# Patient Record
Sex: Female | Born: 1980 | Race: Black or African American | Hispanic: No | Marital: Single | Smoking: Never smoker
Health system: Southern US, Community
[De-identification: ages and names within clinical notes are randomized; demographics above are authoritative.]

## PROBLEM LIST (undated history)

## (undated) DIAGNOSIS — J45909 Unspecified asthma, uncomplicated: Secondary | ICD-10-CM

## (undated) DIAGNOSIS — E119 Type 2 diabetes mellitus without complications: Secondary | ICD-10-CM

## (undated) DIAGNOSIS — D649 Anemia, unspecified: Secondary | ICD-10-CM

## (undated) DIAGNOSIS — I959 Hypotension, unspecified: Secondary | ICD-10-CM

## (undated) HISTORY — PX: TOTAL HIP ARTHROPLASTY: SHX124

---

## 2015-06-10 HISTORY — PX: LEG SURGERY: SHX1003

## 2015-06-10 HISTORY — PX: PELVIC FRACTURE SURGERY: SHX119

## 2017-08-15 ENCOUNTER — Encounter (HOSPITAL_COMMUNITY): Payer: Self-pay | Admitting: Emergency Medicine

## 2017-08-15 ENCOUNTER — Emergency Department (HOSPITAL_COMMUNITY): Payer: Medicaid Other

## 2017-08-15 ENCOUNTER — Emergency Department (HOSPITAL_COMMUNITY)
Admission: EM | Admit: 2017-08-15 | Discharge: 2017-08-16 | Disposition: A | Discharge status: Home / Self Care | Payer: Medicaid Other | Attending: Emergency Medicine | Admitting: Emergency Medicine

## 2017-08-15 DIAGNOSIS — J45909 Unspecified asthma, uncomplicated: Secondary | ICD-10-CM | POA: Insufficient documentation

## 2017-08-15 DIAGNOSIS — Z7984 Long term (current) use of oral hypoglycemic drugs: Secondary | ICD-10-CM | POA: Insufficient documentation

## 2017-08-15 DIAGNOSIS — E119 Type 2 diabetes mellitus without complications: Secondary | ICD-10-CM | POA: Insufficient documentation

## 2017-08-15 DIAGNOSIS — Z79899 Other long term (current) drug therapy: Secondary | ICD-10-CM | POA: Insufficient documentation

## 2017-08-15 DIAGNOSIS — R1084 Generalized abdominal pain: Secondary | ICD-10-CM | POA: Diagnosis not present

## 2017-08-15 HISTORY — DX: Anemia, unspecified: D64.9

## 2017-08-15 HISTORY — DX: Hypotension, unspecified: I95.9

## 2017-08-15 HISTORY — DX: Type 2 diabetes mellitus without complications: E11.9

## 2017-08-15 HISTORY — DX: Unspecified asthma, uncomplicated: J45.909

## 2017-08-15 LAB — CBC WITH DIFFERENTIAL/PLATELET
BASOS ABS: 0 10*3/uL (ref 0.0–0.1)
Basophils Relative: 0 %
EOS PCT: 2 %
Eosinophils Absolute: 0.2 10*3/uL (ref 0.0–0.7)
HEMATOCRIT: 32.2 % — AB (ref 36.0–46.0)
HEMOGLOBIN: 10 g/dL — AB (ref 12.0–15.0)
LYMPHS ABS: 2 10*3/uL (ref 0.7–4.0)
LYMPHS PCT: 20 %
MCH: 24.1 pg — AB (ref 26.0–34.0)
MCHC: 31.1 g/dL (ref 30.0–36.0)
MCV: 77.6 fL — AB (ref 78.0–100.0)
Monocytes Absolute: 0.7 10*3/uL (ref 0.1–1.0)
Monocytes Relative: 7 %
NEUTROS ABS: 7.1 10*3/uL (ref 1.7–7.7)
NEUTROS PCT: 71 %
PLATELETS: 573 10*3/uL — AB (ref 150–400)
RBC: 4.15 MIL/uL (ref 3.87–5.11)
RDW: 14.4 % (ref 11.5–15.5)
WBC: 10.1 10*3/uL (ref 4.0–10.5)

## 2017-08-15 LAB — COMPREHENSIVE METABOLIC PANEL
ALK PHOS: 120 U/L (ref 38–126)
ALT: 7 U/L — AB (ref 14–54)
AST: 12 U/L — AB (ref 15–41)
Albumin: 3.4 g/dL — ABNORMAL LOW (ref 3.5–5.0)
Anion gap: 12 (ref 5–15)
BUN: 10 mg/dL (ref 6–20)
CALCIUM: 9.3 mg/dL (ref 8.9–10.3)
CHLORIDE: 105 mmol/L (ref 101–111)
CO2: 23 mmol/L (ref 22–32)
CREATININE: 0.53 mg/dL (ref 0.44–1.00)
GFR calc Af Amer: >60 mL/min (ref >60)
GFR calc non Af Amer: >60 mL/min (ref >60)
Glucose, Bld: 124 mg/dL — ABNORMAL HIGH (ref 65–99)
Potassium: 3.9 mmol/L (ref 3.5–5.1)
Sodium: 140 mmol/L (ref 135–145)
Total Bilirubin: 0.5 mg/dL (ref 0.3–1.2)
Total Protein: 8.5 g/dL — ABNORMAL HIGH (ref 6.5–8.1)

## 2017-08-15 MED ORDER — IOPAMIDOL (ISOVUE-300) INJECTION 61%
100.0000 mL | Freq: Once | INTRAVENOUS | Status: AC | PRN
  Administered 2017-08-15: 100 mL via INTRAVENOUS
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
## 2017-08-16 LAB — URINALYSIS, ROUTINE W REFLEX MICROSCOPIC
BILIRUBIN URINE: NEGATIVE
Glucose, UA: NEGATIVE mg/dL
Ketones, ur: NEGATIVE mg/dL
Nitrite: NEGATIVE
Protein, ur: 100 mg/dL — AB
pH: 7 (ref 5.0–8.0)

## 2017-08-16 LAB — PREGNANCY, URINE: PREG TEST UR: NEGATIVE

## 2017-08-16 MED ORDER — POLYETHYLENE GLYCOL 3350 17 G PO PACK
17.0000 g | PACK | Freq: Once | ORAL | Status: AC
Start: 2017-08-16 — End: 2017-08-16
  Administered 2017-08-16: 17 g via ORAL
  Filled 2017-08-16: qty 1

## 2017-08-16 MED ORDER — POLYETHYLENE GLYCOL 3350 17 G PO PACK: 17.0000 g | PACK | Freq: Every day | ORAL | 0 refills | Status: AC

## 2017-08-16 NOTE — ED Provider Notes (Signed)
Medical screening examination/treatment/procedure(s) were performed by non-physician practitioner and as supervising physician I was immediately available for consultation/collaboration.   EKG Interpretation None        Signed out pending urinalysis.  Urine has too numerous to count white cells with rare bacteria in the setting of a chronic indwelling Foley.  Urine culture is pending.  Given she is afebrile without significant leukocytosis, would not elect to treat at this time.   Shon BatonHorton, Courtney F, MD 08/16/17 [phone]

## 2017-08-16 NOTE — Discharge Instructions (Signed)
Return if any problems.  Try miralax to help with constipation

## 2017-08-18 LAB — URINE CULTURE: Culture: >=100000 — AB

## 2017-08-19 ENCOUNTER — Telehealth: Payer: Self-pay | Admitting: Emergency Medicine

## 2017-08-19 NOTE — Progress Notes (Signed)
ED Antimicrobial Stewardship Positive Culture Follow Up   Sarah Taylor is an 37 y.o. female who presented to [facility] on 08/15/2017 with a chief complaint of abdominal pain, nausea and vomiting.  Chief Complaint  Patient presents with  . Abdominal Pain    Recent Results (from the past 720 hour(s))  Urine Culture     Status: Abnormal   Collection Time: 08/16/17  1:04 AM  Result Value Ref Range Status   Specimen Description   Final    URINE, CLEAN CATCH Performed at [facility], [address] [place], [place] [address]    Special Requests   Final    NONE Performed at [facility], [address] [place], [place] [address]    Culture (A)  Final    >=100,000 COLONIES/mL KLEBSIELLA PNEUMONIAE Confirmed Extended Spectrum Beta-Lactamase Producer (ESBL).  In bloodstream infections from ESBL organisms, carbapenems are preferred over piperacillin/tazobactam. They are shown to have a lower risk of mortality. Performed at [facility], [address] [place], [place] [address]    Report Status 08/18/2017 FINAL  Final   Organism ID, Bacteria KLEBSIELLA PNEUMONIAE (A)  Final      Susceptibility   Klebsiella pneumoniae - MIC*    AMPICILLIN >=32 RESISTANT Resistant     CEFAZOLIN >=64 RESISTANT Resistant     CEFTRIAXONE >=64 RESISTANT Resistant     CIPROFLOXACIN >=4 RESISTANT Resistant     GENTAMICIN >=16 RESISTANT Resistant     IMIPENEM <=0.25 SENSITIVE Sensitive     NITROFURANTOIN 256 RESISTANT Resistant     TRIMETH/SULFA <=20 SENSITIVE Sensitive     AMPICILLIN/SULBACTAM >=32 RESISTANT Resistant     PIP/TAZO 32 INTERMEDIATE Intermediate     Extended ESBL POSITIVE Resistant     * >=100,000 COLONIES/mL KLEBSIELLA PNEUMONIAE    [x]  Patient discharged originally without antimicrobial agent.   New antibiotic prescription: No treatment. Come back to ED if have worsening symptoms.   ED Provider: Alveria ApleySophia Caccavale, PA-C   Adline PotterSabrina Sueo Cullen, PharmD Pharmacy Resident Pager:  [phone]

## 2017-08-19 NOTE — Telephone Encounter (Signed)
Post ED Visit - Positive Culture Follow-up  Culture report reviewed by antimicrobial stewardship pharmacist:  []  Enzo BiNathan Batchelder, Pharm.D. []  Celedonio MiyamotoJeremy Frens, Pharm.D., BCPS AQ-ID []  Garvin FilaMike Maccia, Pharm.D., BCPS []  Georgina PillionElizabeth Martin, Pharm.D., BCPS []  ZempleMinh Pham, [place]., BCPS, AAHIVP []  Estella HuskMichelle Turner, Pharm.D., BCPS, AAHIVP []  Lysle Pearlachel Rumbarger, PharmD, BCPS []  Blake DivineShannon Parkey, PharmD []  Pollyann SamplesAndy Johnston, PharmD, BCPS [facility] Theresa PharmD  Positive urine culture Treated with ciprofloxacin and Nitrofurantoin,no further patient follow-up is required at this time.  Berle MullMiller, Radley Barto 08/19/2017, 11:16 AM
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
## 2018-06-09 HISTORY — PX: OSTOMY: SHX5997

## 2018-07-12 IMAGING — CT CT ABD-PELV W/ CM
2 of 4 series · 15 of 46 positions shown, 17 images · IV contrast (Isovue)
Comparison: None.

CLINICAL DATA: Abdominal pain for 2 weeks with nausea

EXAM:
CT ABDOMEN AND PELVIS WITH CONTRAST
TECHNIQUE: Multidetector CT imaging of the abdomen and pelvis was performed
using the standard protocol following bolus administration of
intravenous contrast.
CONTRAST:  100mL W8DZFT-6AA IOPAMIDOL (W8DZFT-6AA) INJECTION 61%

[Series 2: axial st · axial · 0.64mm/px · z∈[+1094,+1559]mm · 12 of 103 slices shown, 14 images]
[im 5/103  soft-tissue]
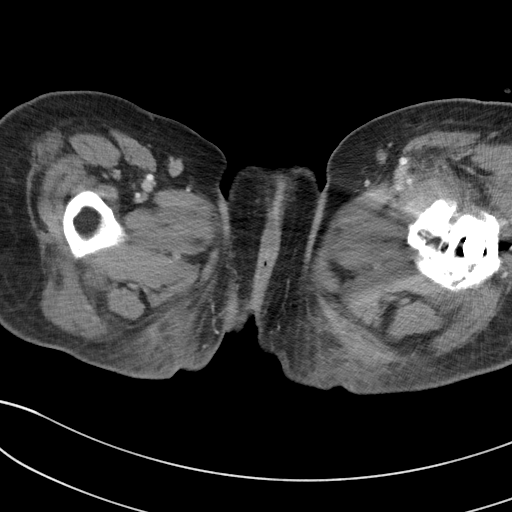
[im 5/103  bone]
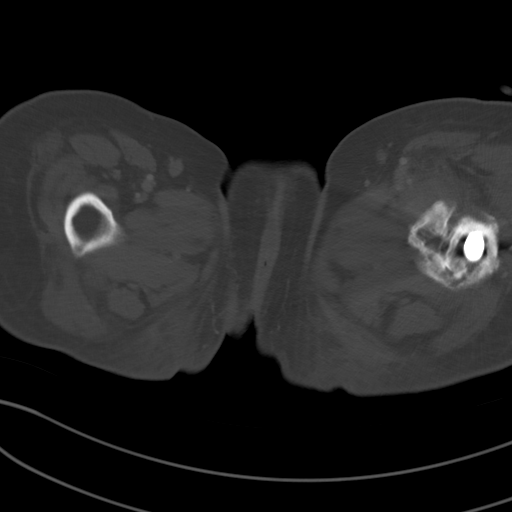
[im 13/103  soft-tissue]
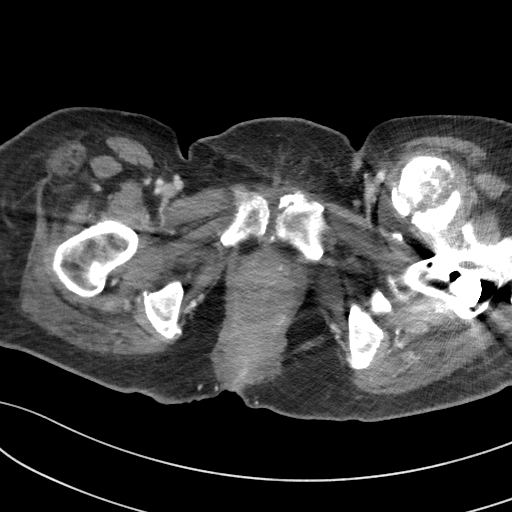
[im 22/103  soft-tissue]
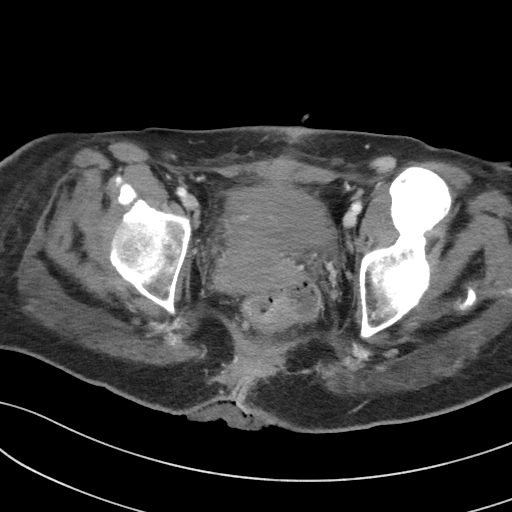
[im 30/103  soft-tissue]
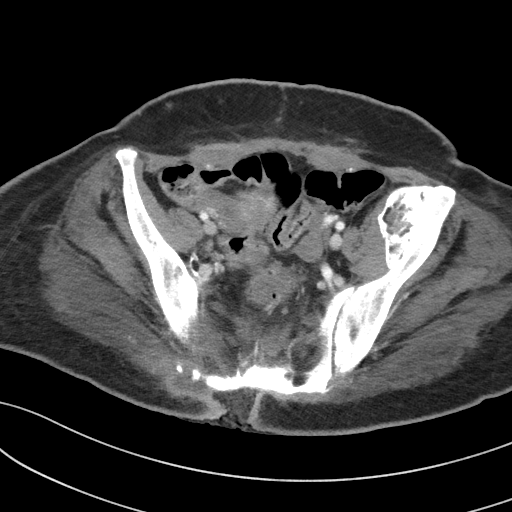
[im 39/103  soft-tissue]
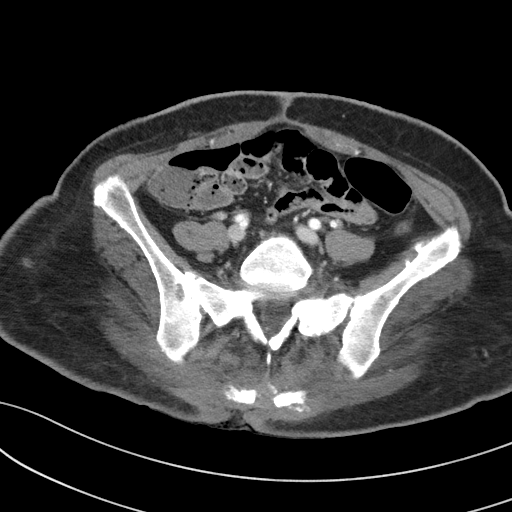
[im 47/103  soft-tissue]
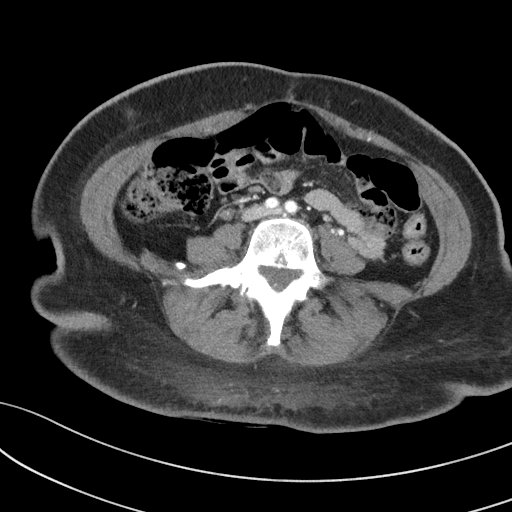
[im 56/103  soft-tissue]
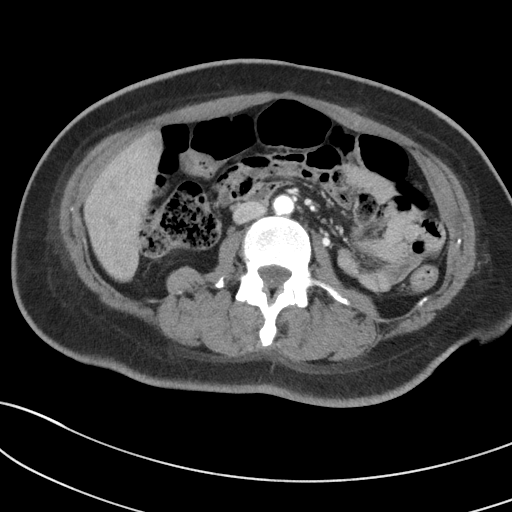
[im 64/103  soft-tissue]
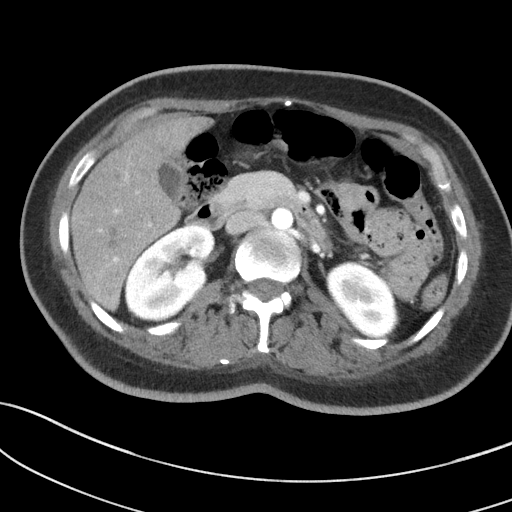
[im 73/103  soft-tissue]
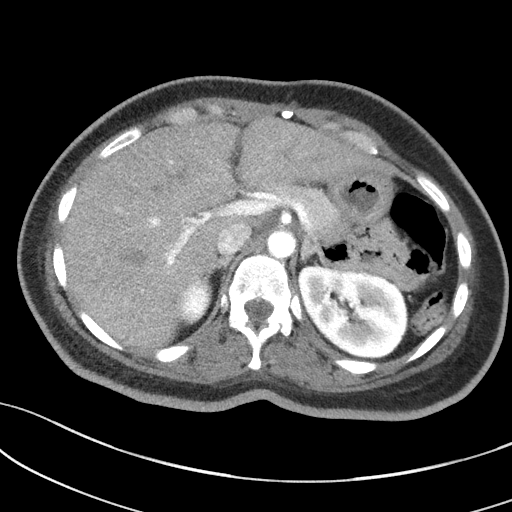
[im 73/103  bone]
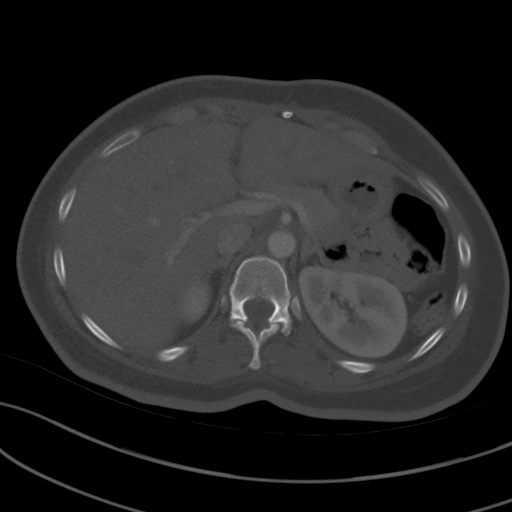
[im 81/103  soft-tissue]
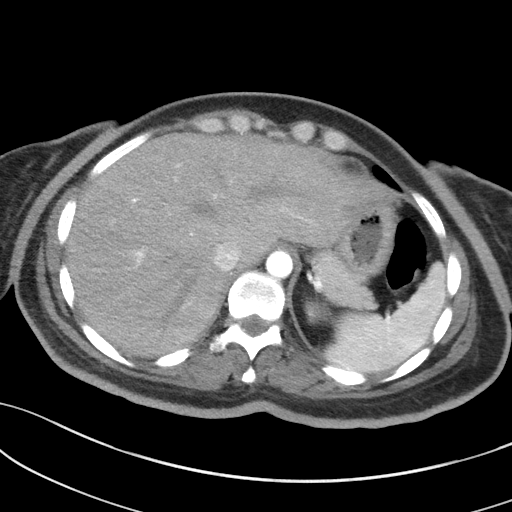
[im 90/103  soft-tissue]
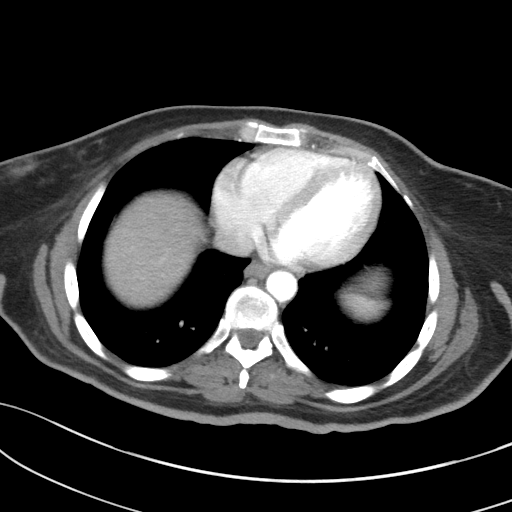
[im 98/103  soft-tissue]
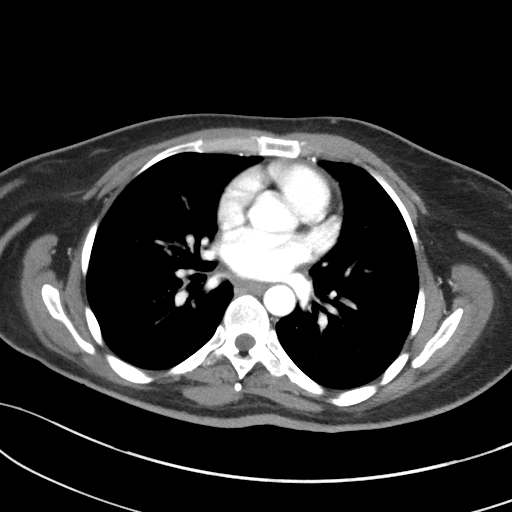

[Series 5: coronal st · coronal · 0.77mm/px · 3 of 97 slices shown]
[im 33/97  soft-tissue]
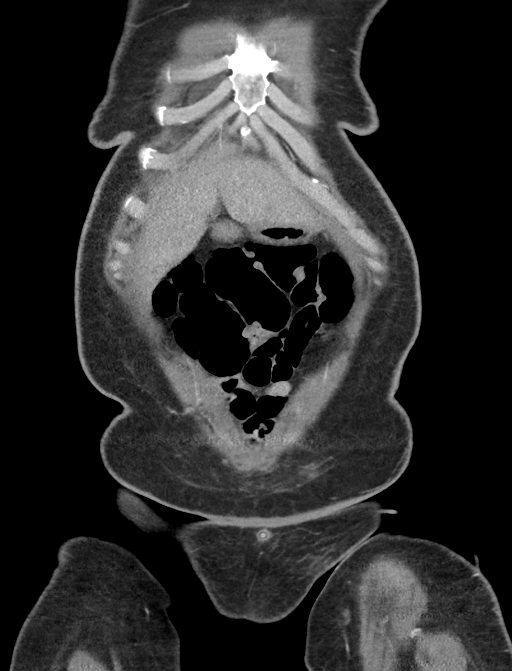
[im 43/97  soft-tissue]
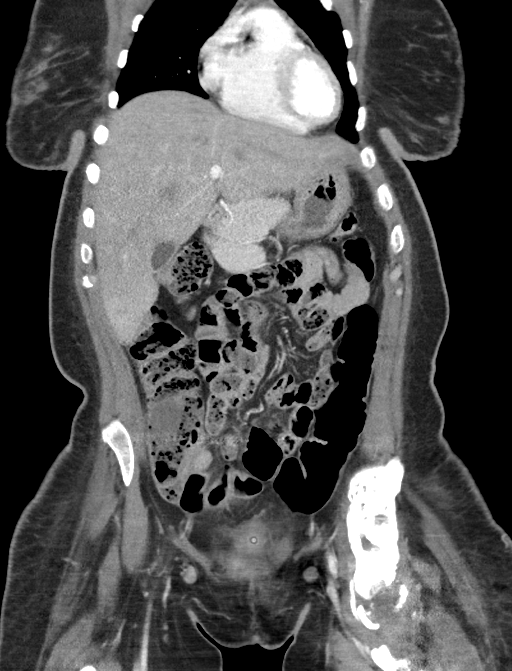
[im 54/97  soft-tissue]
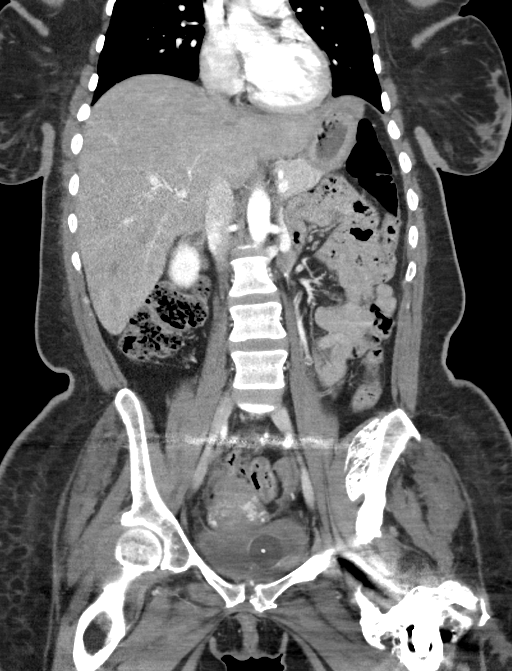

[15 of 46 positions shown; findings below may reference images not displayed]

FINDINGS: Lower chest: Lung bases demonstrate no acute consolidation or
pleural effusion. 4 mm ground-glass nodule in the medial right lung
base, series 4, image number 10. Normal heart size.

Hepatobiliary: Heterogenous fatty infiltration of the liver. No
calcified gallstones or biliary dilatation

Pancreas: Unremarkable. No pancreatic ductal dilatation or
surrounding inflammatory changes.

Spleen: Normal in size without focal abnormality.

Adrenals/Urinary Tract: Adrenal glands are unremarkable. Kidneys are
normal, without renal calculi, focal lesion, or hydronephrosis.
Foley catheter in the bladder.

Stomach/Bowel: Stomach is within normal limits. Appendix appears
normal. No evidence of bowel wall thickening, distention, or
inflammatory changes.

Vascular/Lymphatic: Nonaneurysmal aorta. No significantly enlarged
lymph nodes.

Reproductive: Uterus and bilateral adnexa are unremarkable.

Other: Negative for free air or free fluid.

Musculoskeletal: Deep sacral decubitus ulcer. Skin thickening and
soft tissue edema posteriorly. Sclerosis and bony remodeling of the
sacrococcygeal bone. No rim enhancing fluid collection in the region
of the ulcer.

Fixating rod and screws in the proximal left femur with screw tips
approaching the femoral head cortex. Marked bony deformity of the
trochanter of the left femur. Large calcified and soft tissue mass
anterior to the left hip measuring 5.9 x 6.2 cm, contiguous with
bulky ossification at the left iliac bone.
IMPRESSION: 1. No CT evidence for acute intra-abdominal or pelvic abnormality.
2. Heterogenous fatty infiltration of the liver
3. 4 mm ground-glass right lung base nodule. No follow-up
recommended. This recommendation follows the consensus statement:
Guidelines for Management of Incidental Pulmonary Nodules Detected
[DATE].
4. Deep sacral decubitus ulcer with sclerosis and bony remodeling of
the sacrococcygeal bone, likely due to sequela of chronic
osteomyelitis and pressure related changes
5. Posttraumatic and postsurgical deformity of the proximal left
femur with partially calcified mass anterior to the left hip joint,
this is contiguous with bony mass at the left iliac bone and iliacus
muscle, possibly due to prior hematoma or soft tissue trauma.
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
## 2021-07-24 NOTE — Progress Notes (Signed)
Surgical Instructions    Your procedure is scheduled on 07/29/21.  Report to [facility] Main Entrance "A" at 5:30 A.M., then check in with the Admitting office.  Call this number if you have problems the morning of surgery:  [phone]   If you have any questions prior to your surgery date call [phone]: Open Monday-Friday 8am-4pm    Remember:  Do not eat after midnight the night before your surgery  You may drink clear liquids until 4:30am the morning of your surgery.   Clear liquids allowed are: Water, Non-Citrus Juices (without pulp), Carbonated Beverages, Clear Tea, Black Coffee ONLY (NO MILK, CREAM OR POWDERED CREAMER of any kind), and Gatorade    Take these medicines the morning of surgery with A SIP OF WATER:  cetirizine (ZYRTEC)  dicyclomine (BENTYL) escitalopram (LEXAPRO) gabapentin (NEURONTIN)  metoCLOPramide (REGLAN)  midodrine (PROAMATINE)  nitrofurantoin, macrocrystal-monohydrate, (MACROBID) oxybutynin (DITROPAN)  IF NEEDED: fluticasone (FLONASE) oxyCODONE (OXY IR/ROXICODONE)  VENTOLIN inhaler- bring inhaler with you the day of surgery  As of today, STOP taking any Aspirin (unless otherwise instructed by your surgeon) Aleve, Naproxen, Ibuprofen, Motrin, Advil, Goody's, BC's, all herbal medications, fish oil, and all vitamins.  WHAT DO I DO ABOUT MY DIABETES MEDICATION?   Do not take oral diabetes medicines (pills) the morning of surgery.     THE MORNING OF SURGERY, do not take JANUMET.  The day of surgery, do not take other diabetes injectables, including Byetta (exenatide), Bydureon (exenatide ER), Victoza (liraglutide), or Trulicity (dulaglutide).  If your CBG is greater than 220 mg/dL, you may take  of your sliding scale (correction) dose of insulin.   HOW TO MANAGE YOUR DIABETES BEFORE AND AFTER SURGERY  Why is it important to control my blood sugar before and after surgery? Improving blood sugar levels before and after surgery helps healing  and can limit problems. A way of improving blood sugar control is eating a healthy diet by:  Eating less sugar and carbohydrates  Increasing activity/exercise  Talking with your doctor about reaching your blood sugar goals High blood sugars (greater than 180 mg/dL) can raise your risk of infections and slow your recovery, so you will need to focus on controlling your diabetes during the weeks before surgery. Make sure that the doctor who takes care of your diabetes knows about your planned surgery including the date and location.  How do I manage my blood sugar before surgery? Check your blood sugar at least 4 times a day, starting 2 days before surgery, to make sure that the level is not too high or low.  Check your blood sugar the morning of your surgery when you wake up and every 2 hours until you get to the Short Stay unit.  If your blood sugar is less than 70 mg/dL, you will need to treat for low blood sugar: Do not take insulin. Treat a low blood sugar (less than 70 mg/dL) with  cup of clear juice (cranberry or apple), 4 glucose tablets, OR glucose gel. Recheck blood sugar in 15 minutes after treatment (to make sure it is greater than 70 mg/dL). If your blood sugar is not greater than 70 mg/dL on recheck, call [phone] for further instructions. Report your blood sugar to the short stay nurse when you get to Short Stay.  If you are admitted to the hospital after surgery: Your blood sugar will be checked by the staff and you will probably be given insulin after surgery (instead of oral diabetes medicines) to make sure  you have good blood sugar levels. The goal for blood sugar control after surgery is 80-180 mg/dL.       Do not wear jewelry or makeup Do not wear lotions, powders, perfumes or deodorant. Do not shave 48 hours prior to surgery.   Do not bring valuables to the hospital. Do not wear nail polish, gel polish, artificial nails, or any other type of covering on natural  nails (fingers and toes) If you have artificial nails or gel coating that need to be removed by a nail salon, please have this removed prior to surgery. Artificial nails or gel coating may interfere with anesthesia's ability to adequately monitor your vital signs.  [facility] is not responsible for any belongings or valuables. .   Do NOT Smoke (Tobacco/Vaping)  24 hours prior to your procedure  If you use a CPAP at night, you may bring your mask for your overnight stay.   Contacts, glasses, hearing aids, dentures or partials may not be worn into surgery, please bring cases for these belongings   For patients admitted to the hospital, discharge time will be determined by your treatment team.   Patients discharged the day of surgery will not be allowed to drive home, and someone needs to stay with them for 24 hours.  NO VISITORS WILL BE ALLOWED IN PRE-OP WHERE PATIENTS ARE PREPPED FOR SURGERY.  ONLY 1 SUPPORT PERSON MAY BE PRESENT IN THE WAITING ROOM WHILE YOU ARE IN SURGERY.  IF YOU ARE TO BE ADMITTED, ONCE YOU ARE IN YOUR ROOM YOU WILL BE ALLOWED TWO (2) VISITORS. 1 (ONE) VISITOR MAY STAY OVERNIGHT BUT MUST ARRIVE TO THE ROOM BY 8pm.  Minor children may have two parents present. Special consideration for safety and communication needs will be reviewed on a case by case basis.  Special instructions:    Oral Hygiene is also important to reduce your risk of infection.  Remember - BRUSH YOUR TEETH THE MORNING OF SURGERY WITH YOUR REGULAR TOOTHPASTE   [facility]- Preparing For Surgery  Before surgery, you can play an important role. Because skin is not sterile, your skin needs to be as free of germs as possible. You can reduce the number of germs on your skin by washing with CHG (chlorahexidine gluconate) Soap before surgery.  CHG is an antiseptic cleaner which kills germs and bonds with the skin to continue killing germs even after washing.     Please do not use if you have an allergy to CHG  or antibacterial soaps. If your skin becomes reddened/irritated stop using the CHG.  Do not shave (including legs and underarms) for at least 48 hours prior to first CHG shower. It is OK to shave your face.  Please follow these instructions carefully.     Shower the NIGHT BEFORE SURGERY and the MORNING OF SURGERY with CHG Soap.   If you chose to wash your hair, wash your hair first as usual with your normal shampoo. After you shampoo, rinse your hair and body thoroughly to remove the shampoo.  Then [employer] and genitals (private parts) with your normal soap and rinse thoroughly to remove soap.  After that Use CHG Soap as you would any other liquid soap. You can apply CHG directly to the skin and wash gently with a scrungie or a clean washcloth.   Apply the CHG Soap to your body ONLY FROM THE NECK DOWN.  Do not use on open wounds or open sores. Avoid contact with your eyes, ears,  mouth and genitals (private parts). Wash Face and genitals (private parts)  with your normal soap.   Wash thoroughly, paying special attention to the area where your surgery will be performed.  Thoroughly rinse your body with warm water from the neck down.  DO NOT shower/wash with your normal soap after using and rinsing off the CHG Soap.  Pat yourself dry with a CLEAN TOWEL.  Wear CLEAN PAJAMAS to bed the night before surgery  Place CLEAN SHEETS on your bed the night before your surgery  DO NOT SLEEP WITH PETS.   Day of Surgery: Take a shower with CHG soap. Wear Clean/Comfortable clothing the morning of surgery Do not apply any deodorants/lotions.   Remember to brush your teeth WITH YOUR REGULAR TOOTHPASTE.    COVID testing  If you are going to stay overnight or be admitted after your procedure/surgery and require a pre-op COVID test, please follow these instructions after your COVID test   You are not required to quarantine however you are required to wear a well-fitting mask when you are out and  around people not in your household.  If your mask becomes wet or soiled, replace with a new one.  Wash your hands often with soap and water for 20 seconds or clean your hands with an alcohol-based hand sanitizer that contains at least 60% alcohol.  Do not share personal items.  Notify your provider: if you are in close contact with someone who has COVID  or if you develop a fever of 100.4 or greater, sneezing, cough, sore throat, shortness of breath or body aches.    Please read over the following fact sheets that you were given.

## 2021-07-25 ENCOUNTER — Other Ambulatory Visit: Payer: Self-pay

## 2021-07-25 ENCOUNTER — Encounter (HOSPITAL_COMMUNITY): Payer: Self-pay

## 2021-07-25 ENCOUNTER — Encounter (HOSPITAL_COMMUNITY)
Admission: RE | Admit: 2021-07-25 | Discharge: 2021-07-25 | Disposition: A | Discharge status: Home / Self Care | Payer: Medicare Other | Source: Ambulatory Visit | Attending: Plastic Surgery | Admitting: Plastic Surgery

## 2021-07-25 VITALS — BP 130/95 | HR 80 | Temp 98.5°F | Resp 17 | Ht 75.0 in | Wt 226.9 lb

## 2021-07-25 DIAGNOSIS — Z01818 Encounter for other preprocedural examination: Secondary | ICD-10-CM | POA: Diagnosis present

## 2021-07-25 DIAGNOSIS — Z20822 Contact with and (suspected) exposure to covid-19: Secondary | ICD-10-CM | POA: Insufficient documentation

## 2021-07-25 DIAGNOSIS — E119 Type 2 diabetes mellitus without complications: Secondary | ICD-10-CM

## 2021-07-25 LAB — CBC WITH DIFFERENTIAL/PLATELET
Abs Immature Granulocytes: 0.08 10*3/uL — ABNORMAL HIGH (ref 0.00–0.07)
Basophils Absolute: 0.1 10*3/uL (ref 0.0–0.1)
Basophils Relative: 1 %
Eosinophils Absolute: 0.1 10*3/uL (ref 0.0–0.5)
Eosinophils Relative: 1 %
HCT: 36.5 % (ref 36.0–46.0)
Hemoglobin: 11.3 g/dL — ABNORMAL LOW (ref 12.0–15.0)
Immature Granulocytes: 1 %
Lymphocytes Relative: 19 %
Lymphs Abs: 1.7 10*3/uL (ref 0.7–4.0)
MCH: 24.9 pg — ABNORMAL LOW (ref 26.0–34.0)
MCHC: 31 g/dL (ref 30.0–36.0)
MCV: 80.4 fL (ref 80.0–100.0)
Monocytes Absolute: 0.5 10*3/uL (ref 0.1–1.0)
Monocytes Relative: 5 %
Neutro Abs: 6.6 10*3/uL (ref 1.7–7.7)
Neutrophils Relative %: 73 %
Platelets: 339 10*3/uL (ref 150–400)
RBC: 4.54 MIL/uL (ref 3.87–5.11)
RDW: 14 % (ref 11.5–15.5)
WBC: 8.9 10*3/uL (ref 4.0–10.5)
nRBC: 0 % (ref 0.0–0.2)

## 2021-07-25 LAB — SURGICAL PCR SCREEN
MRSA, PCR: POSITIVE — AB
Staphylococcus aureus: POSITIVE — AB

## 2021-07-25 LAB — BASIC METABOLIC PANEL
Anion gap: 11 (ref 5–15)
BUN: 13 mg/dL (ref 6–20)
CO2: 20 mmol/L — ABNORMAL LOW (ref 22–32)
Calcium: 9.1 mg/dL (ref 8.9–10.3)
Chloride: 110 mmol/L (ref 98–111)
Creatinine, Ser: 0.67 mg/dL (ref 0.44–1.00)
GFR, Estimated: >60 mL/min (ref >60)
Glucose, Bld: 186 mg/dL — ABNORMAL HIGH (ref 70–99)
Potassium: 3.9 mmol/L (ref 3.5–5.1)
Sodium: 141 mmol/L (ref 135–145)

## 2021-07-25 LAB — HEMOGLOBIN A1C
Hgb A1c MFr Bld: 6.9 % — ABNORMAL HIGH (ref 4.8–5.6)
Mean Plasma Glucose: 151.33 mg/dL

## 2021-07-25 LAB — SARS CORONAVIRUS 2 (TAT 6-24 HRS): SARS Coronavirus 2: NEGATIVE

## 2021-07-25 LAB — GLUCOSE, CAPILLARY: Glucose-Capillary: 136 mg/dL — ABNORMAL HIGH (ref 70–99)

## 2021-07-25 NOTE — Progress Notes (Signed)
PCP - Virgina Jock FNP Cardiologist - Denies  PPM/ICD - Denies  Chest x-ray - N/A EKG - 07/25/21 Stress Test - Denies ECHO - Denies Cardiac Cath - Denies  Sleep Study - Denies   Fasting Blood Sugar - 80 Checks Blood Sugar ___2__ times a day  Blood Thinner Instructions: N/A Aspirin Instructions: N/A  ERAS Protcol - Yes PRE-SURGERY Ensure or G2- No  COVID TEST- 07/25/21 in PAT   Anesthesia review: No  Patient denies shortness of breath, fever, cough and chest pain at PAT appointment   All instructions explained to the patient, with a verbal understanding of the material. Patient agrees to go over the instructions while at home for a better understanding. Patient also instructed to self quarantine after being tested for COVID-19. The opportunity to ask questions was provided.

## 2021-07-28 ENCOUNTER — Encounter (HOSPITAL_COMMUNITY): Payer: Self-pay | Admitting: Plastic Surgery

## 2021-07-28 NOTE — Anesthesia Preprocedure Evaluation (Addendum)
Anesthesia Evaluation  Patient identified by MRN, date of birth, ID band Patient awake    Reviewed: Allergy & Precautions, H&P , NPO status , Patient's Chart, lab work & pertinent test results  Airway Mallampati: II  TM Distance: >3 FB Neck ROM: Full    Dental no notable dental hx. (+) Teeth Intact, Dental Advisory Given   Pulmonary neg pulmonary ROS, asthma ,    Pulmonary exam normal breath sounds clear to auscultation       Cardiovascular Exercise Tolerance: Good negative cardio ROS Normal cardiovascular exam Rhythm:Regular Rate:Normal     Neuro/Psych negative neurological ROS  negative psych ROS   GI/Hepatic negative GI ROS, Neg liver ROS,   Endo/Other  negative endocrine ROSdiabetes  Renal/GU negative Renal ROS  negative genitourinary   Musculoskeletal negative musculoskeletal ROS (+)   Abdominal   Peds negative pediatric ROS (+)  Hematology negative hematology ROS (+) Blood dyscrasia, anemia ,   Anesthesia Other Findings   Reproductive/Obstetrics negative OB ROS                            Anesthesia Physical Anesthesia Plan  ASA: 2  Anesthesia Plan: General   Post-op Pain Management:    Induction: Intravenous  PONV Risk Score and Plan: 3 and Ondansetron, Dexamethasone and Midazolam  Airway Management Planned: Oral ETT and LMA  Additional Equipment:   Intra-op Plan:   Post-operative Plan: Extubation in OR  Informed Consent: I have reviewed the patients History and Physical, chart, labs and discussed the procedure including the risks, benefits and alternatives for the proposed anesthesia with the patient or authorized representative who has indicated his/her understanding and acceptance.       Plan Discussed with: Anesthesiologist and CRNA  Anesthesia Plan Comments: (  )        Anesthesia Quick Evaluation

## 2021-07-29 ENCOUNTER — Ambulatory Visit (HOSPITAL_COMMUNITY): Payer: Medicare Other | Admitting: Anesthesiology

## 2021-07-29 ENCOUNTER — Other Ambulatory Visit: Payer: Self-pay

## 2021-07-29 ENCOUNTER — Ambulatory Visit (HOSPITAL_BASED_OUTPATIENT_CLINIC_OR_DEPARTMENT_OTHER): Payer: Medicare Other | Admitting: Anesthesiology

## 2021-07-29 ENCOUNTER — Encounter (HOSPITAL_COMMUNITY)
Admission: RE | Disposition: A | Discharge status: Home / Self Care | Payer: Self-pay | Source: Home / Self Care | Attending: Plastic Surgery

## 2021-07-29 ENCOUNTER — Observation Stay (HOSPITAL_COMMUNITY)
Admission: RE | Admit: 2021-07-29 | Discharge: 2021-07-30 | Disposition: A | Discharge status: Home / Self Care | Payer: Medicare Other | Attending: Plastic Surgery | Admitting: Plastic Surgery

## 2021-07-29 ENCOUNTER — Encounter (HOSPITAL_COMMUNITY): Payer: Self-pay | Admitting: Plastic Surgery

## 2021-07-29 DIAGNOSIS — N62 Hypertrophy of breast: Principal | ICD-10-CM | POA: Insufficient documentation

## 2021-07-29 DIAGNOSIS — L304 Erythema intertrigo: Secondary | ICD-10-CM | POA: Insufficient documentation

## 2021-07-29 DIAGNOSIS — D649 Anemia, unspecified: Secondary | ICD-10-CM | POA: Insufficient documentation

## 2021-07-29 DIAGNOSIS — E119 Type 2 diabetes mellitus without complications: Secondary | ICD-10-CM | POA: Insufficient documentation

## 2021-07-29 DIAGNOSIS — J45909 Unspecified asthma, uncomplicated: Secondary | ICD-10-CM | POA: Insufficient documentation

## 2021-07-29 DIAGNOSIS — S24102S Unspecified injury at T2-T6 level of thoracic spinal cord, sequela: Secondary | ICD-10-CM | POA: Insufficient documentation

## 2021-07-29 DIAGNOSIS — G8220 Paraplegia, unspecified: Secondary | ICD-10-CM | POA: Insufficient documentation

## 2021-07-29 DIAGNOSIS — M549 Dorsalgia, unspecified: Secondary | ICD-10-CM | POA: Insufficient documentation

## 2021-07-29 DIAGNOSIS — M542 Cervicalgia: Secondary | ICD-10-CM | POA: Insufficient documentation

## 2021-07-29 DIAGNOSIS — G8929 Other chronic pain: Secondary | ICD-10-CM | POA: Insufficient documentation

## 2021-07-29 DIAGNOSIS — Z853 Personal history of malignant neoplasm of breast: Secondary | ICD-10-CM | POA: Insufficient documentation

## 2021-07-29 HISTORY — PX: BREAST REDUCTION SURGERY: SHX8

## 2021-07-29 LAB — GLUCOSE, CAPILLARY
Glucose-Capillary: 120 mg/dL — ABNORMAL HIGH (ref 70–99)
Glucose-Capillary: 113 mg/dL — ABNORMAL HIGH (ref 70–99)
Glucose-Capillary: 160 mg/dL — ABNORMAL HIGH (ref 70–99)
Glucose-Capillary: 247 mg/dL — ABNORMAL HIGH (ref 70–99)
Glucose-Capillary: 170 mg/dL — ABNORMAL HIGH (ref 70–99)

## 2021-07-29 SURGERY — MAMMOPLASTY, REDUCTION
Anesthesia: General | Site: Breast | Laterality: Bilateral

## 2021-07-29 MED ORDER — 0.9 % SODIUM CHLORIDE (POUR BTL) OPTIME
TOPICAL | Status: DC | PRN
  Administered 2021-07-29: 1000 mL

## 2021-07-29 MED ORDER — PHENYLEPHRINE HCL-NACL 20-0.9 MG/250ML-% IV SOLN
INTRAVENOUS | Status: DC | PRN
Start: 2021-07-29 — End: 2021-07-29
  Administered 2021-07-29: 50 ug/min via INTRAVENOUS

## 2021-07-29 MED ORDER — ENOXAPARIN SODIUM 40 MG/0.4ML IJ SOSY
40.0000 mg | PREFILLED_SYRINGE | INTRAMUSCULAR | Status: DC
  Administered 2021-07-30: 40 mg via SUBCUTANEOUS
  Filled 2021-07-29: qty 0.4

## 2021-07-29 MED ORDER — SITAGLIP PHOS-METFORMIN HCL ER 50-1000 MG PO TB24: 1.0000 | ORAL_TABLET | Freq: Two times a day (BID) | ORAL | Status: DC

## 2021-07-29 MED ORDER — LINAGLIPTIN 5 MG PO TABS
5.0000 mg | ORAL_TABLET | Freq: Every day | ORAL | Status: DC
  Administered 2021-07-30: 5 mg via ORAL
  Filled 2021-07-29: qty 1

## 2021-07-29 MED ORDER — ONDANSETRON 4 MG PO TBDP: 4.0000 mg | ORAL_TABLET | Freq: Four times a day (QID) | ORAL | Status: DC | PRN

## 2021-07-29 MED ORDER — MIDAZOLAM HCL 2 MG/2ML IJ SOLN
INTRAMUSCULAR | Status: AC
  Filled 2021-07-29: qty 2

## 2021-07-29 MED ORDER — METFORMIN HCL 500 MG PO TABS
1000.0000 mg | ORAL_TABLET | Freq: Two times a day (BID) | ORAL | Status: DC
Start: 2021-07-29 — End: 2021-07-30
  Administered 2021-07-29 – 2021-07-30 (×2): 1000 mg via ORAL
  Filled 2021-07-29 (×2): qty 2

## 2021-07-29 MED ORDER — NITROFURANTOIN MONOHYD MACRO 100 MG PO CAPS
100.0000 mg | ORAL_CAPSULE | Freq: Every day | ORAL | Status: DC
  Administered 2021-07-29 – 2021-07-30 (×2): 100 mg via ORAL
  Filled 2021-07-29 (×2): qty 1

## 2021-07-29 MED ORDER — MUPIROCIN 2 % EX OINT
TOPICAL_OINTMENT | Freq: Two times a day (BID) | CUTANEOUS | Status: DC
  Filled 2021-07-29 (×2): qty 22

## 2021-07-29 MED ORDER — MELATONIN 3 MG PO TABS
3.0000 mg | ORAL_TABLET | Freq: Every evening | ORAL | Status: DC | PRN
  Administered 2021-07-29: 3 mg via ORAL
  Filled 2021-07-29: qty 1

## 2021-07-29 MED ORDER — TRAZODONE HCL 100 MG PO TABS
200.0000 mg | ORAL_TABLET | Freq: Every day | ORAL | Status: DC
  Administered 2021-07-29: 200 mg via ORAL
  Filled 2021-07-29: qty 2

## 2021-07-29 MED ORDER — LINACLOTIDE 145 MCG PO CAPS
145.0000 ug | ORAL_CAPSULE | ORAL | Status: DC
  Administered 2021-07-29: 145 ug via ORAL
  Filled 2021-07-29: qty 1

## 2021-07-29 MED ORDER — ACETAMINOPHEN 325 MG PO TABS: 325.0000 mg | ORAL_TABLET | ORAL | Status: DC | PRN

## 2021-07-29 MED ORDER — CELECOXIB 200 MG PO CAPS
200.0000 mg | ORAL_CAPSULE | ORAL | Status: AC
  Administered 2021-07-29: 200 mg via ORAL
  Filled 2021-07-29: qty 1

## 2021-07-29 MED ORDER — PROPOFOL 10 MG/ML IV BOLUS
INTRAVENOUS | Status: DC | PRN
  Administered 2021-07-29: 80 mg via INTRAVENOUS

## 2021-07-29 MED ORDER — LORATADINE 10 MG PO TABS
10.0000 mg | ORAL_TABLET | Freq: Every day | ORAL | Status: DC
Start: 2021-07-29 — End: 2021-07-30
  Administered 2021-07-29 – 2021-07-30 (×2): 10 mg via ORAL
  Filled 2021-07-29 (×2): qty 1

## 2021-07-29 MED ORDER — ESCITALOPRAM OXALATE 20 MG PO TABS
20.0000 mg | ORAL_TABLET | Freq: Every day | ORAL | Status: DC
Start: 2021-07-29 — End: 2021-07-30
  Administered 2021-07-29 – 2021-07-30 (×2): 20 mg via ORAL
  Filled 2021-07-29 (×2): qty 1

## 2021-07-29 MED ORDER — DEXAMETHASONE SODIUM PHOSPHATE 10 MG/ML IJ SOLN
INTRAMUSCULAR | Status: AC
  Filled 2021-07-29: qty 1

## 2021-07-29 MED ORDER — LACTATED RINGERS IV SOLN: INTRAVENOUS | Status: DC

## 2021-07-29 MED ORDER — CEFAZOLIN SODIUM-DEXTROSE 2-4 GM/100ML-% IV SOLN
2.0000 g | INTRAVENOUS | Status: AC
  Administered 2021-07-29: 2 g via INTRAVENOUS
  Filled 2021-07-29: qty 100

## 2021-07-29 MED ORDER — CHLORHEXIDINE GLUCONATE CLOTH 2 % EX PADS: 6.0000 | MEDICATED_PAD | Freq: Once | CUTANEOUS | Status: DC

## 2021-07-29 MED ORDER — OXYBUTYNIN CHLORIDE 5 MG PO TABS
5.0000 mg | ORAL_TABLET | Freq: Two times a day (BID) | ORAL | Status: DC
Start: 2021-07-29 — End: 2021-07-30
  Administered 2021-07-29 – 2021-07-30 (×3): 5 mg via ORAL
  Filled 2021-07-29 (×3): qty 1

## 2021-07-29 MED ORDER — GABAPENTIN 300 MG PO CAPS
300.0000 mg | ORAL_CAPSULE | Freq: Three times a day (TID) | ORAL | Status: DC
Start: 2021-07-29 — End: 2021-07-30
  Administered 2021-07-29 – 2021-07-30 (×3): 300 mg via ORAL
  Filled 2021-07-29 (×3): qty 1

## 2021-07-29 MED ORDER — POTASSIUM CHLORIDE IN NACL 20-0.45 MEQ/L-% IV SOLN
INTRAVENOUS | Status: DC
  Filled 2021-07-29 (×2): qty 1000

## 2021-07-29 MED ORDER — ROCURONIUM BROMIDE 10 MG/ML (PF) SYRINGE
PREFILLED_SYRINGE | INTRAVENOUS | Status: DC | PRN
  Administered 2021-07-29: 20 mg via INTRAVENOUS
  Administered 2021-07-29: 60 mg via INTRAVENOUS
  Administered 2021-07-29: 20 mg via INTRAVENOUS

## 2021-07-29 MED ORDER — MEPERIDINE HCL 25 MG/ML IJ SOLN: 6.2500 mg | INTRAMUSCULAR | Status: DC | PRN

## 2021-07-29 MED ORDER — CHLORHEXIDINE GLUCONATE 0.12 % MT SOLN
15.0000 mL | Freq: Once | OROMUCOSAL | Status: AC
  Administered 2021-07-29: 15 mL via OROMUCOSAL
  Filled 2021-07-29: qty 15

## 2021-07-29 MED ORDER — FENTANYL CITRATE (PF) 250 MCG/5ML IJ SOLN
INTRAMUSCULAR | Status: AC
  Filled 2021-07-29: qty 5

## 2021-07-29 MED ORDER — ONDANSETRON HCL 4 MG/2ML IJ SOLN
INTRAMUSCULAR | Status: AC
  Filled 2021-07-29: qty 2

## 2021-07-29 MED ORDER — LIDOCAINE 2% (20 MG/ML) 5 ML SYRINGE
INTRAMUSCULAR | Status: DC | PRN
  Administered 2021-07-29: 60 mg via INTRAVENOUS

## 2021-07-29 MED ORDER — MIDAZOLAM HCL 2 MG/2ML IJ SOLN
INTRAMUSCULAR | Status: DC | PRN
  Administered 2021-07-29: 2 mg via INTRAVENOUS

## 2021-07-29 MED ORDER — HYDROMORPHONE HCL 1 MG/ML IJ SOLN
0.5000 mg | INTRAMUSCULAR | Status: DC | PRN
  Administered 2021-07-29: 0.5 mg via INTRAVENOUS
  Filled 2021-07-29: qty 0.5

## 2021-07-29 MED ORDER — BUPIVACAINE HCL (PF) 0.5 % IJ SOLN
INTRAMUSCULAR | Status: AC
  Filled 2021-07-29: qty 30

## 2021-07-29 MED ORDER — DOCUSATE SODIUM 100 MG PO CAPS
100.0000 mg | ORAL_CAPSULE | Freq: Three times a day (TID) | ORAL | Status: DC
  Administered 2021-07-29 – 2021-07-30 (×3): 100 mg via ORAL
  Filled 2021-07-29 (×3): qty 1

## 2021-07-29 MED ORDER — FERROUS SULFATE 325 (65 FE) MG PO TABS
325.0000 mg | ORAL_TABLET | Freq: Two times a day (BID) | ORAL | Status: DC
  Administered 2021-07-29 – 2021-07-30 (×3): 325 mg via ORAL
  Filled 2021-07-29 (×3): qty 1

## 2021-07-29 MED ORDER — DEXAMETHASONE SODIUM PHOSPHATE 10 MG/ML IJ SOLN
INTRAMUSCULAR | Status: DC | PRN
  Administered 2021-07-29: 8 mg via INTRAVENOUS

## 2021-07-29 MED ORDER — BUPIVACAINE HCL (PF) 0.5 % IJ SOLN
INTRAMUSCULAR | Status: DC | PRN
  Administered 2021-07-29: 30 mL

## 2021-07-29 MED ORDER — ONDANSETRON HCL 4 MG/2ML IJ SOLN: 4.0000 mg | Freq: Four times a day (QID) | INTRAMUSCULAR | Status: DC | PRN

## 2021-07-29 MED ORDER — KETOROLAC TROMETHAMINE 30 MG/ML IJ SOLN
30.0000 mg | Freq: Three times a day (TID) | INTRAMUSCULAR | Status: AC
  Administered 2021-07-29 – 2021-07-30 (×3): 30 mg via INTRAVENOUS
  Filled 2021-07-29 (×3): qty 1

## 2021-07-29 MED ORDER — PROPOFOL 10 MG/ML IV BOLUS
INTRAVENOUS | Status: AC
  Filled 2021-07-29: qty 20

## 2021-07-29 MED ORDER — ONDANSETRON HCL 4 MG/2ML IJ SOLN: 4.0000 mg | Freq: Once | INTRAMUSCULAR | Status: DC | PRN

## 2021-07-29 MED ORDER — ATORVASTATIN CALCIUM 40 MG PO TABS
40.0000 mg | ORAL_TABLET | Freq: Every day | ORAL | Status: DC
  Administered 2021-07-29: 40 mg via ORAL
  Filled 2021-07-29: qty 1

## 2021-07-29 MED ORDER — FENTANYL CITRATE (PF) 250 MCG/5ML IJ SOLN
INTRAMUSCULAR | Status: DC | PRN
  Administered 2021-07-29 (×4): 50 ug via INTRAVENOUS

## 2021-07-29 MED ORDER — METOCLOPRAMIDE HCL 5 MG PO TABS
5.0000 mg | ORAL_TABLET | Freq: Three times a day (TID) | ORAL | Status: DC
  Administered 2021-07-29 – 2021-07-30 (×3): 5 mg via ORAL
  Filled 2021-07-29 (×3): qty 1

## 2021-07-29 MED ORDER — OXYCODONE HCL 5 MG PO TABS
5.0000 mg | ORAL_TABLET | ORAL | Status: DC | PRN
  Administered 2021-07-29: 5 mg via ORAL
  Administered 2021-07-30: 10 mg via ORAL
  Filled 2021-07-29: qty 1
  Filled 2021-07-29: qty 2

## 2021-07-29 MED ORDER — INSULIN ASPART 100 UNIT/ML IJ SOLN: 0.0000 [IU] | INTRAMUSCULAR | Status: DC | PRN

## 2021-07-29 MED ORDER — OXYCODONE HCL 5 MG/5ML PO SOLN: 5.0000 mg | Freq: Once | ORAL | Status: DC | PRN

## 2021-07-29 MED ORDER — FENTANYL CITRATE (PF) 100 MCG/2ML IJ SOLN
INTRAMUSCULAR | Status: AC
  Filled 2021-07-29: qty 2

## 2021-07-29 MED ORDER — INSULIN ASPART 100 UNIT/ML IJ SOLN
0.0000 [IU] | Freq: Three times a day (TID) | INTRAMUSCULAR | Status: DC
  Administered 2021-07-29: 5 [IU] via SUBCUTANEOUS
  Administered 2021-07-30: 2 [IU] via SUBCUTANEOUS

## 2021-07-29 MED ORDER — LIDOCAINE 2% (20 MG/ML) 5 ML SYRINGE
INTRAMUSCULAR | Status: AC
  Filled 2021-07-29: qty 10

## 2021-07-29 MED ORDER — SUGAMMADEX SODIUM 200 MG/2ML IV SOLN
INTRAVENOUS | Status: DC | PRN
  Administered 2021-07-29: 200 mg via INTRAVENOUS

## 2021-07-29 MED ORDER — ORAL CARE MOUTH RINSE: 15.0000 mL | Freq: Once | OROMUCOSAL | Status: AC

## 2021-07-29 MED ORDER — ONDANSETRON HCL 4 MG/2ML IJ SOLN
INTRAMUSCULAR | Status: DC | PRN
  Administered 2021-07-29: 4 mg via INTRAVENOUS

## 2021-07-29 MED ORDER — ACETAMINOPHEN 160 MG/5ML PO SOLN: 325.0000 mg | ORAL | Status: DC | PRN

## 2021-07-29 MED ORDER — ACETAMINOPHEN 500 MG PO TABS
1000.0000 mg | ORAL_TABLET | ORAL | Status: AC
  Administered 2021-07-29: 1000 mg via ORAL
  Filled 2021-07-29: qty 2

## 2021-07-29 MED ORDER — PHENYLEPHRINE 40 MCG/ML (10ML) SYRINGE FOR IV PUSH (FOR BLOOD PRESSURE SUPPORT)
PREFILLED_SYRINGE | INTRAVENOUS | Status: AC
  Filled 2021-07-29: qty 10

## 2021-07-29 MED ORDER — FENTANYL CITRATE (PF) 100 MCG/2ML IJ SOLN
25.0000 ug | INTRAMUSCULAR | Status: DC | PRN
  Administered 2021-07-29 (×2): 50 ug via INTRAVENOUS

## 2021-07-29 MED ORDER — DICYCLOMINE HCL 20 MG PO TABS
20.0000 mg | ORAL_TABLET | Freq: Two times a day (BID) | ORAL | Status: DC
Start: 2021-07-29 — End: 2021-07-30
  Administered 2021-07-29 – 2021-07-30 (×3): 20 mg via ORAL
  Filled 2021-07-29 (×4): qty 1

## 2021-07-29 MED ORDER — OXYCODONE HCL 5 MG PO TABS: 5.0000 mg | ORAL_TABLET | Freq: Once | ORAL | Status: DC | PRN

## 2021-07-29 MED ORDER — ALBUTEROL SULFATE (2.5 MG/3ML) 0.083% IN NEBU: 2.5000 mg | INHALATION_SOLUTION | Freq: Four times a day (QID) | RESPIRATORY_TRACT | Status: DC | PRN

## 2021-07-29 MED ORDER — MIDODRINE HCL 5 MG PO TABS
10.0000 mg | ORAL_TABLET | Freq: Three times a day (TID) | ORAL | Status: DC
  Administered 2021-07-29 – 2021-07-30 (×3): 10 mg via ORAL
  Filled 2021-07-29 (×3): qty 2

## 2021-07-29 SURGICAL SUPPLY — 47 items
BAG COUNTER SPONGE SURGICOUNT (BAG) ×3 IMPLANT
BINDER BREAST LRG (GAUZE/BANDAGES/DRESSINGS) IMPLANT
BINDER BREAST MEDIUM (GAUZE/BANDAGES/DRESSINGS) IMPLANT
BINDER BREAST XLRG (GAUZE/BANDAGES/DRESSINGS) ×1 IMPLANT
BINDER BREAST XXLRG (GAUZE/BANDAGES/DRESSINGS) IMPLANT
BLADE SURG 10 STRL SS (BLADE) ×8 IMPLANT
BNDG GAUZE ELAST 4 BULKY (GAUZE/BANDAGES/DRESSINGS) ×4 IMPLANT
CANISTER SUCT 3000ML PPV (MISCELLANEOUS) ×2 IMPLANT
CHLORAPREP W/TINT 26 (MISCELLANEOUS) ×4 IMPLANT
COVER SURGICAL LIGHT HANDLE (MISCELLANEOUS) ×2 IMPLANT
DECANTER SPIKE VIAL GLASS SM (MISCELLANEOUS) ×1 IMPLANT
DERMABOND ADVANCED (GAUZE/BANDAGES/DRESSINGS) ×3
DERMABOND ADVANCED .7 DNX12 (GAUZE/BANDAGES/DRESSINGS) ×2 IMPLANT
DRAIN CHANNEL 15F RND FF W/TCR (WOUND CARE) ×2 IMPLANT
DRAPE HALF SHEET 40X57 (DRAPES) ×4 IMPLANT
DRAPE TOP ARMCOVERS (MISCELLANEOUS) ×2 IMPLANT
DRAPE U-SHAPE 76X120 STRL (DRAPES) ×2 IMPLANT
DRSG PAD ABDOMINAL 8X10 ST (GAUZE/BANDAGES/DRESSINGS) ×4 IMPLANT
ELECT BLADE 4.0 EZ CLEAN MEGAD (MISCELLANEOUS)
ELECT COATED BLADE 2.86 ST (ELECTRODE) ×2 IMPLANT
ELECT REM PT RETURN 9FT ADLT (ELECTROSURGICAL) ×2
ELECTRODE BLDE 4.0 EZ CLN MEGD (MISCELLANEOUS) IMPLANT
ELECTRODE REM PT RTRN 9FT ADLT (ELECTROSURGICAL) ×1 IMPLANT
EVACUATOR SILICONE 100CC (DRAIN) ×2 IMPLANT
GLOVE SURG ENC MOIS LTX SZ6 (GLOVE) ×2 IMPLANT
GLOVE SURG POLY MICRO LF SZ5.5 (GLOVE) ×1 IMPLANT
GLOVE SURG UNDER POLY LF SZ6.5 (GLOVE) ×1 IMPLANT
GLOVE SURG UNDER POLY LF SZ7 (GLOVE) ×1 IMPLANT
GOWN STRL REUS W/ TWL LRG LVL3 (GOWN DISPOSABLE) ×2 IMPLANT
GOWN STRL REUS W/TWL LRG LVL3 (GOWN DISPOSABLE) ×4
MARKER SKIN DUAL TIP RULER LAB (MISCELLANEOUS) ×1 IMPLANT
NDL HYPO 25GX1X1/2 BEV (NEEDLE) ×1 IMPLANT
NEEDLE HYPO 25GX1X1/2 BEV (NEEDLE) ×2 IMPLANT
NS IRRIG 1000ML POUR BTL (IV SOLUTION) ×2 IMPLANT
PACK GENERAL/GYN (CUSTOM PROCEDURE TRAY) ×2 IMPLANT
PIN SAFETY STERILE (MISCELLANEOUS) ×2 IMPLANT
SPONGE T-LAP 18X18 ~~LOC~~+RFID (SPONGE) ×4 IMPLANT
STAPLER VISISTAT 35W (STAPLE) ×2 IMPLANT
SUT ETHILON 2 0 FS 18 (SUTURE) ×2 IMPLANT
SUT MNCRL AB 4-0 PS2 18 (SUTURE) ×6 IMPLANT
SUT VIC AB 3-0 PS1 18 (SUTURE) ×7
SUT VIC AB 3-0 PS1 18XBRD (SUTURE) ×4 IMPLANT
SUT VICRYL 4-0 PS2 18IN ABS (SUTURE) ×4 IMPLANT
SUT VLOC 90 P-14 23 (SUTURE) ×2 IMPLANT
SYR CONTROL 10ML LL (SYRINGE) ×2 IMPLANT
TOWEL GREEN STERILE (TOWEL DISPOSABLE) ×2 IMPLANT
WATER STERILE IRR 1000ML POUR (IV SOLUTION) ×1 IMPLANT

## 2021-07-29 NOTE — Interval H&P Note (Signed)
History and Physical Interval Note:  07/29/2021 6:50 AM  Sarah Taylor  has presented today for surgery, with the diagnosis of macromastia, chronic neck and back pain, intertrigo.  The various methods of treatment have been discussed with the patient and family. After consideration of risks, benefits and other options for treatment, the patient has consented to  Procedure(s): MAMMARY REDUCTION  (BREAST) (Bilateral) as a surgical intervention.  The patient's history has been reviewed, patient examined, no change in status, stable for surgery.  I have reviewed the patient's chart and labs.  Questions were answered to the patient's satisfaction.     Irean Hong Chancy Smigiel

## 2021-07-29 NOTE — Op Note (Signed)
Operative Note   DATE OF OPERATION: 2.20.23  LOCATION: [facility] Main OR-observation  SURGICAL DIVISION: Plastic Surgery  PREOPERATIVE DIAGNOSES:  1. Macromastia 2. Chronic neck and back pain 3. Intertrigo  POSTOPERATIVE DIAGNOSES:  same  PROCEDURE:  Bilateral breast reduction  SURGEON: Glenna Fellows MD MBA  ASSISTANT: Tarry Kos RNFA  ANESTHESIA:  General.   EBL: 75 ml  COMPLICATIONS: None immediate.   INDICATIONS FOR PROCEDURE:  The patient, Sarah Taylor, is a 41 y.o. female born on June 13, 1980, is here for treatment chronic neck and back pain intertrigo in setting of macromastia that has failed conservative measures.   FINDINGS: Right reduction 591 g Left reduction 538 g  DESCRIPTION OF PROCEDURE:  The patient was marked sitting upright in the preoperative area to mark sternal notch, chest midline, anterior axillary lines, inframammary folds. The location of new nipple areolar complex was marked at level of on inframammary fold on anterior surface breast by palpation. This was marked symmetric over bilateral breasts. With aid of Wise pattern marker, location of new nipple areolar complex and vertical limbs (7 cm) were marked by displacement of breasts along meridian. The patient was taken to the operating room. SCDs were placed and IV antibiotics were given. The patient's operative site was prepped and draped in a sterile fashion. A time out was performed and all information was confirmed to be correct.     Over left breast, superior medial pedicle marked and nipple areolar complex incised with 42 mm diameter marker. Pedicle deepithlialized and developed to chest wall. Breast tissue resected over lower pole. Medial and lateral flaps developed. Additional lateral and superior breast tissue excised. Breast tailor tacked closed.    I then directed attention to right breast where superior medial pedicle designed. NAC marked with 42 mm diameter marker. The pedicle was deepithelialized.  Pedicle developed to chest wall. Breast tissue resected over lower pole. Medial and lateral flaps developed. Additional lateral and superior pole breast tissue excised. Breast tailor tacked closed and assessed for symmetry. Breast cavities irrigated and hemostasis obtained. Local anesthetic infiltrated throughout each breast. 15 Fr JP placed in each breast and secured with 2-0 nylon. Closure completed bilateral with 3-0 vicryl to approximate dermis along inframammary fold and vertical limb. NAC inset with 4-0 vicryl in dermis. Skin closure completed with 4-0 monocryl subcuticular over vertical limb and NAC. 3-0 V lock used to close each IMF in subcuticular fashion. Tissue adhesive applied. Dry dressing and breast binder applied.  The patient was allowed to wake from anesthesia, extubated and taken to the recovery room in satisfactory condition.   SPECIMENS: right and left breast reduction  DRAINS: 15 Fr JP in right and left breast

## 2021-07-29 NOTE — Transfer of Care (Signed)
Immediate Anesthesia Transfer of Care Note  Patient: Sarah Taylor  Procedure(s) Performed: MAMMARY REDUCTION  (BREAST) (Bilateral: Breast)  Patient Location: PACU  Anesthesia Type:General  Level of Consciousness: awake, alert  and oriented  Airway & Oxygen Therapy: Patient Spontanous Breathing and Patient connected to face mask oxygen  Post-op Assessment: Report given to RN, Post -op Vital signs reviewed and stable and Patient moving all extremities X 4  Post vital signs: Reviewed and stable  Last Vitals:  Vitals Value Taken Time  BP 111/66 07/29/21 1031  Temp    Pulse 110 07/29/21 1033  Resp 20 07/29/21 1033  SpO2 99 % 07/29/21 1033  Vitals shown include unvalidated device data.  Last Pain:  Vitals:   07/29/21 0625  TempSrc:   PainSc: 0-No pain         Complications: No notable events documented.

## 2021-07-29 NOTE — Anesthesia Procedure Notes (Signed)
Procedure Name: Intubation Date/Time: 07/29/2021 7:19 AM Performed by: Inda Coke, CRNA Pre-anesthesia Checklist: Patient identified, Emergency Drugs available, Suction available and Patient being monitored Patient Re-evaluated:Patient Re-evaluated prior to induction Oxygen Delivery Method: Circle System Utilized Preoxygenation: Pre-oxygenation with 100% oxygen Induction Type: IV induction Ventilation: Mask ventilation without difficulty Laryngoscope Size: Mac and 3 Grade View: Grade I Tube type: Oral Tube size: 7.0 mm Number of attempts: 1 Airway Equipment and Method: Stylet Placement Confirmation: ETT inserted through vocal cords under direct vision, positive ETCO2 and breath sounds checked- equal and bilateral Secured at: 21 cm Tube secured with: Tape Dental Injury: Teeth and Oropharynx as per pre-operative assessment

## 2021-07-29 NOTE — Anesthesia Postprocedure Evaluation (Signed)
Anesthesia Post Note  Patient: Sarah Taylor  Procedure(s) Performed: MAMMARY REDUCTION  (BREAST) (Bilateral: Breast)     Patient location during evaluation: PACU Anesthesia Type: General Level of consciousness: awake and alert Pain management: pain level controlled Vital Signs Assessment: post-procedure vital signs reviewed and stable Respiratory status: spontaneous breathing, nonlabored ventilation, respiratory function stable and patient connected to nasal cannula oxygen Cardiovascular status: blood pressure returned to baseline and stable Postop Assessment: no apparent nausea or vomiting Anesthetic complications: no   No notable events documented.  Last Vitals:  Vitals:   07/29/21 1210 07/29/21 1241  BP: 104/66 105/79  Pulse: 95 74  Resp: (!) 8 16  Temp: 36.7 C 36.7 C  SpO2: 92% 98%    Last Pain:  Vitals:   07/29/21 1241  TempSrc: Oral  PainSc: 2                  Arsalan Brisbin

## 2021-07-30 ENCOUNTER — Encounter (HOSPITAL_COMMUNITY): Payer: Self-pay | Admitting: Plastic Surgery

## 2021-07-30 DIAGNOSIS — N62 Hypertrophy of breast: Secondary | ICD-10-CM | POA: Diagnosis not present

## 2021-07-30 LAB — SURGICAL PATHOLOGY

## 2021-07-30 LAB — GLUCOSE, CAPILLARY
Glucose-Capillary: 122 mg/dL — ABNORMAL HIGH (ref 70–99)
Glucose-Capillary: 124 mg/dL — ABNORMAL HIGH (ref 70–99)

## 2021-07-30 NOTE — Progress Notes (Signed)
Discharge instructions given to patient. Patient verbalizes understand. JP drain reviewed with patient with instructions on how to empty and recharge. Assisted patient with dressing and transport to her chair. Patient discharged

## 2021-07-30 NOTE — Discharge Summary (Signed)
Physician Discharge Summary  Patient ID: Kailly Richoux MRN: [id number] DOB/AGE: [birth year] 41 y.o.  Admit date: 07/29/2021 Discharge date: 07/30/2021  Admission Diagnoses: Macromastia chronic neck and back pain  Discharge Diagnoses:  same  Discharged Condition: stable  Hospital Course: Post operatively patient did well with pain controlled, tolerating diet. Patient instructed on drain care and bathing.  Treatments: surgery: bilateral breast reduction 2.20.23  Discharge Exam: Blood pressure (!) 83/57, pulse 80, temperature 97.8 F (36.6 C), temperature source Oral, resp. rate 18, height 5\' 3"  (1.6 m), weight 81.6 kg, SpO2 100 %. Incision/Wound: breasts soft incisions intact NACs viable drains serosanguinous  Disposition: Discharge disposition: 01-Home or Self Care       Discharge Instructions     Call MD for:  redness, tenderness, or signs of infection (pain, swelling, bleeding, redness, odor or green/yellow discharge around incision site)   Complete by: As directed    Call MD for:  temperature >100.5   Complete by: As directed    Discharge instructions   Complete by: As directed    Ok to remove dressings and shower am 2.22.23. Soap and water ok, pat incisions dry. No creams or ointments over incisions. Do not let drains dangle in shower, attach to lanyard or similar.Strip and record drains twice daily and bring log to clinic visit.  Breast binder or soft compression bra all other times.  Ok to raise arms above shoulders for bathing and dressing.  No house yard work or exercise until cleared by MD.   Patient received all Rx preop   Driving Restrictions   Complete by: As directed    No driving if taking prescription pain medication   Lifting restrictions   Complete by: As directed    No lifting > 5-10 lbs until cleared by MD   Resume previous diet   Complete by: As directed       Allergies as of 07/30/2021       Reactions   Latex Itching        Medication  List     TAKE these medications    atorvastatin 40 MG tablet Commonly known as: LIPITOR Take 40 mg by mouth at bedtime.   cetirizine 10 MG tablet Commonly known as: ZYRTEC Take 10 mg by mouth daily.   dicyclomine 20 MG tablet Commonly known as: BENTYL Take 20 mg by mouth 2 (two) times daily.   docusate sodium 100 MG capsule Commonly known as: COLACE Take 100 mg by mouth 3 (three) times daily.   escitalopram 20 MG tablet Commonly known as: LEXAPRO Take 20 mg by mouth daily.   ferrous sulfate 325 (65 FE) MG EC tablet Take 325 mg by mouth 2 (two) times daily.   fluticasone 50 MCG/ACT nasal spray Commonly known as: FLONASE Place 2 sprays into both nostrils daily as needed for allergies or rhinitis.   gabapentin 300 MG capsule Commonly known as: NEURONTIN Take 300 mg by mouth 3 (three) times daily.   ibuprofen 200 MG tablet Commonly known as: ADVIL Take 600 mg by mouth every 6 (six) hours as needed for moderate pain.   Janumet XR 50-1000 MG Tb24 Generic drug: SitaGLIPtin-MetFORMIN HCl Take 1 tablet by mouth 2 (two) times daily.   linaclotide 145 MCG Caps capsule Commonly known as: LINZESS Take 145 mcg by mouth every Monday, Wednesday, and Friday.   melatonin 3 MG Tabs tablet Take 6 mg by mouth at bedtime.   metoCLOPramide 5 MG tablet Commonly known as: REGLAN Take 5 mg by  mouth 3 (three) times daily.   midodrine 10 MG tablet Commonly known as: PROAMATINE Take 10 mg by mouth 3 (three) times daily.   nitrofurantoin (macrocrystal-monohydrate) 100 MG capsule Commonly known as: MACROBID Take 100 mg by mouth daily.   oxybutynin 5 MG tablet Commonly known as: DITROPAN Take 5 mg by mouth 2 (two) times daily.   oxyCODONE 5 MG immediate release tablet Commonly known as: Oxy IR/ROXICODONE Take 5 mg by mouth 3 (three) times daily as needed.   polyethylene glycol 17 g packet Commonly known as: MiraLax Take 17 g by mouth daily. What changed:  when to take  this reasons to take this   traZODone 100 MG tablet Commonly known as: DESYREL Take 200 mg by mouth at bedtime.   Ventolin HFA 108 (90 Base) MCG/ACT inhaler Generic drug: albuterol Inhale 2 puffs into the lungs every 6 (six) hours as needed for shortness of breath.   Vitamin D (Ergocalciferol) 1.25 MG (50000 UNIT) Caps capsule Commonly known as: DRISDOL Take 50,000 Units by mouth every Monday.   Xulane 150-35 MCG/24HR transdermal patch Generic drug: norelgestromin-ethinyl estradiol Place 1 patch onto the skin every Wednesday.        Follow-up Information     Glenna Fellows, MD Follow up in 1 week(s).   Specialty: Plastic Surgery Why: as scheduled Contact information: [address] [place] [address] [phone]                 Signed: Glenna Fellows 07/30/2021, 9:10 AM
